# Patient Record
Sex: Female | Born: 1965 | Race: White | Marital: Married | Smoking: Former smoker
Health system: Northeastern US, Academic
[De-identification: ages and names within clinical notes are randomized; demographics above are authoritative.]

## PROBLEM LIST (undated history)

## (undated) DIAGNOSIS — I219 Acute myocardial infarction, unspecified: Secondary | ICD-10-CM

## (undated) DIAGNOSIS — R011 Cardiac murmur, unspecified: Secondary | ICD-10-CM

## (undated) DIAGNOSIS — F419 Anxiety disorder, unspecified: Secondary | ICD-10-CM

## (undated) HISTORY — DX: Acute myocardial infarction, unspecified: I21.9

---

## 2012-09-16 HISTORY — PX: LUMBAR LAMINECTOMY: SHX95

## 2014-09-16 HISTORY — PX: CHOLECYSTECTOMY: SHX55

## 2015-07-31 ENCOUNTER — Other Ambulatory Visit: Payer: Self-pay | Admitting: Internal Medicine

## 2015-07-31 LAB — POTASSIUM: Potassium: 3.6 meq/L^meq/L (ref 3.5–5.1)

## 2015-07-31 LAB — CREATININE, SERUM
Creatinine: 0.8 mg/dL^mg/dL (ref 0.6–1.3)
GFR,Black: 92 mL/min/{1.73_m2}
GFR,Caucasian: 76 mL/min/{1.73_m2}

## 2015-07-31 LAB — CBC AND DIFFERENTIAL
Baso # K/uL: 0.1 10*3/uL (ref 0.0–0.1)
Basophil %: 0.9 %^% (ref 0.1–1.2)
Eos # K/uL: 1.8 10*3/uL — ABNORMAL HIGH (ref 0.0–0.4)
Eosinophil %: 20.9 %^% — ABNORMAL HIGH (ref 0.7–5.8)
Hematocrit: 44.1 %^% (ref 34.1–44.9)
Hemoglobin: 15.4 g/dL^g/dL (ref 11.2–15.7)
Immature Granulocytes Absolute: 0.01 10*3/uL (ref 0.0–0.2)
Immature Granulocytes: 0.1 %^% (ref 0.0–2.0)
Lymph # K/uL: 2.1 10*3/uL (ref 1.2–3.7)
Lymphocyte %: 24.1 %^% (ref 19.3–51.7)
MCH: 33 pg^pg — ABNORMAL HIGH (ref 25.6–32.2)
MCHC: 34.9 g/dL^g/dL (ref 32.2–35.5)
MCV: 94.4 fL^fL (ref 79.4–94.8)
Mono # K/uL: 0.4 10*3/uL (ref 0.2–0.9)
Monocyte %: 4.9 %^% (ref 4.7–12.5)
Neut # K/uL: 4.2 10*3/uL (ref 1.6–6.0)
Nucl RBC %: 0 /100 WBC^/100 WBC (ref 0.0–0.2)
Platelets: 383 10*3/uL — ABNORMAL HIGH (ref 182–369)
RBC Distribution Width-SD: 43.9 fL^fL (ref 36.4–46.3)
RBC: 4.67 10*6/uL (ref 3.93–5.22)
RDW: 12.8 %^% (ref 11.7–14.4)
Seg Neut %: 49.1 %^% (ref 34.0–71.1)
WBC: 8.5 10*3/uL (ref 4.0–10.0)

## 2015-07-31 LAB — ALBUMIN: Albumin: 3.9 g/dL^g/dL (ref 3.4–5.0)

## 2015-07-31 LAB — ALKALINE PHOSPHATASE: Alk Phos: 94 U/L^U/L (ref 45–117)

## 2015-07-31 LAB — GGT: GGT: 88 U/L^U/L — ABNORMAL HIGH (ref 5–55)

## 2015-07-31 LAB — SODIUM: Sodium: 138 meq/L^meq/L (ref 136–145)

## 2015-07-31 LAB — T BILI: Bilirubin,Total: 1.4 mg/dL^mg/dL — ABNORMAL HIGH (ref 0.2–1.0)

## 2015-07-31 LAB — AMYLASE: Amylase: 76 U/L^U/L (ref 25–115)

## 2015-07-31 LAB — SEDIMENTATION RATE, AUTOMATED: Sedimentation Rate: 1 mm/hr^mm/hr (ref 0–20)

## 2015-07-31 LAB — AST: AST: 16 U/L^U/L (ref 11–37)

## 2015-07-31 LAB — LIPASE: Lipase: 156 U/L^U/L (ref 73–393)

## 2015-07-31 LAB — BUN: Lab: 15 mg/dL^mg/dL (ref 7–18)

## 2015-07-31 LAB — ALT: ALT: 21 U/L^U/L (ref 12–78)

## 2015-08-01 ENCOUNTER — Other Ambulatory Visit: Payer: Self-pay | Admitting: Internal Medicine

## 2015-08-02 LAB — TISSUE TRANSGLUT,IGA
IgA: 284 mg/dL^mg/dL (ref 70–400)
tTG,IgA: 5.1 AB/Units^AB/Units (ref 0.0–19.9)

## 2015-08-02 LAB — H. PYLORI ANTIBODY, IGG: H Pylori IgG: NEGATIVE

## 2015-08-22 ENCOUNTER — Other Ambulatory Visit: Payer: Self-pay | Admitting: Anesthesiology

## 2015-08-22 LAB — PREGNANCY, URINE: Preg Test,UR: NEGATIVE

## 2015-08-24 LAB — SURGICAL PATHOLOGY

## 2015-08-24 LAB — ENDOMYSIAL (EMA) AB IGG: Endomysial IgG: <1:10 {titer}

## 2016-10-24 ENCOUNTER — Ambulatory Visit: Payer: Self-pay | Admitting: Otolaryngology

## 2016-10-24 ENCOUNTER — Encounter: Payer: Self-pay | Admitting: Otolaryngology

## 2016-10-24 VITALS — BP 136/74 | HR 65 | Temp 96.5°F | Ht 62.0 in | Wt 135.0 lb

## 2016-10-24 DIAGNOSIS — H9202 Otalgia, left ear: Secondary | ICD-10-CM

## 2016-10-24 DIAGNOSIS — J3489 Other specified disorders of nose and nasal sinuses: Secondary | ICD-10-CM

## 2016-10-24 NOTE — Progress Notes (Signed)
Erika Pennington was referred by Dr. Provider.    Subjective  Chief Complaint: She presents today with   Chief Complaint   Patient presents with    New Patient Visit     left sided ear pain   .    HPI: This is a 51 y.o. old female, who presents for intermittent left ear pain x1 year. Pain is sharp, and is directly into her ear. Occasionally reports left eye pain at the same time. Denies any visual changes. Typically will last all day before resolving. Occurs 3-4 times a week. Occurs more at night and in the morning.  Grinds teeth. Guard does not fit, so not wearing. Never diagnosed with TMJ. Denies TMJ click or tenderness. Taking Tylenol. Avoids NSAIDS d/t plavix use. Denies any hx of migraines or headaches.  Denies any facial pain, numbness or tingling.  No head/neck trauma. Dentition good. No Cspine issues. No pain, numbness or tingling of upper extremities.  No hx of ear infections, drainage, or ear surgery. No tinnitus, dizziness, or vertigo. Hearing is good.     CT of the neck done on 08/20/16, at [facility] in [place]. The interpretation revealed a "small focal rounded low attenuating collection within the palatine tonsil on the left. Uncertain etiology".    Chronic nasal congestion and scabbing in nostrils.     She is just traveling through the area. She will be moving to [place] in the near future.    Outpatient Prescriptions Marked as Taking for the 10/24/16 encounter (Office Visit) with Jacinto Reap, NP   Medication Sig Dispense Refill    amLODIPine (NORVASC) 2.5 MG tablet Take 2.5 mg by mouth daily      aspirin 81 MG tablet Take 81 mg by mouth daily      isosorbide mononitrate (IMDUR) 30 MG 24 hr tablet Take 30 mg by mouth daily      clopidogrel (PLAVIX) 75 MG tablet Take 75 mg by mouth daily      clonazePAM (KLONOPIN) 0.25 MG disintegrating tablet Take 0.25 mg by mouth 2 times daily as needed for Anxiety         Allergies: Penicillins    Problem List:  does not have a problem list on  file.    Medical History:   Past Medical History:   Diagnosis Date    Heart attack 06/10/16       Surgical History:   Past Surgical History:   Procedure Laterality Date    LAMINECTOMY          Family History: family history is not on file.    Social History:   Social History   Substance Use Topics    Smoking status: Former Smoker    Smokeless tobacco: Never Used    Alcohol use Yes      Comment: occ       ROS: In addition to that mentioned in the HPI:  No fevers, chills, decreased energy, unexplained weight loss. All other systems are negative.     Objective  Vitals:    10/24/16 1302   BP: 136/74   Pulse: 65   Temp: 35.8 C (96.5 F)   Weight: 61.2 kg (135 lb)   Height: 1.575 m (5\' 2" )       Exam  Constitution:  Appears well developed and well nourished. No signs of acute distress present. Patient is cooperative and overall behavior is appropriate.  Head / Face: Atraumatic, normocephalic on inspection. No scars present. Percussion reveals no  sinus tenderness.  Eyes:  EOMI in both eyes. Conjunctivae clear. No periorbital edema of the upper and lower lids. PERRL. Sclerae clear.  Ears: Inspection reveals no lesion of the ears, swelling of the ears, or tenderness of the ears.  No discharge, mass or stenosis in the auditory canals. translucent, normal landmarks, and no retraction. No swelling or tenderness of the post auricular area bilaterally.  Nose: External Nose: exhibits no deformity or lesions. Internal Nose: No discharge from the nasal mucosae, no edema, no erythema of the nasal mucosae. Nasal Septum: large anterior septal perforation. Scabbing and dried blood noted. Nasal turbinates are normal in color and size.  Oral cavity: Lips appear normal and healthy. Dentition is normal for age. Gums appear healthy. Tongue shows a smooth surface and symmetry. Floor of the mouth appears normal. Salivary glands normal in size with no asymmetry. Submandibular and parotid ducts are patent bilaterally. Oral mucosa moist with  no thrush and no mucositis. Hard palate normal in appearance.  Oropharynx: No lesions or masses. Soft palate normal in appearance. Uvula midline and normal in size. Tonsils appear normal. Posterior pharyngeal mucosa appears normal.  Neck: Symmetric. Palpation reveals no swelling or tenderness. No masses appreciated. Trachea is midline and has good landmarks. Thyroid exhibits no palpable enlargement, nodules or tenderness on palpation.  Respiratory: Respiration rate is normal. Breath sounds are equal bilaterally, with no rales, rhonchi, or wheezes appreciated over the lungs bilaterally.  Cardiovascular: Rate is regular. Rhythm is regular.  Lymph:  No visible or palpable cervical lymphadenopathy. No visible or palpable supraclavicular lymphadenopathy.  Musculoskeletal: Temporal Mandibular Joints: ROM normal. No crepitus, pain, or pain with movement.  Skin:  Skin warm and dry with no evidence of unusual rashes or suspicious lesions.  Scalp normal to inspection.  Neurological: Alert and oriented x 3.  Psychological: Mood is normal. Patient's affect is appropriate to mood. Speech is spontaneous with regular rate, rhythm, and volume.    Assessment  51 y.o. female    ICD-10-CM ICD-9-CM    1. Otalgia, left H92.02 388.70    2. Nasal septum perforation J34.89 478.19        Plan    Saline nasal irrigation twice a day. Keep septum moist. Ayr saline gel. Run a humidifier.  Recommend having septal perforation evaluated when she gets established in [place].    F/U with Dr.Decicco. Will obtain an audio for further evaluation of the ear pain.  The ear pain may be d/t TMJ disorder or of a neurological etiology. I do not believe that her sx are consistent with trigeminal neuralgia.              Seen by : Jacinto ReapJACQUELINE E Emelee Rodocker, NP2/04/2017

## 2016-10-30 ENCOUNTER — Ambulatory Visit: Payer: Self-pay | Admitting: Otolaryngology

## 2016-11-04 ENCOUNTER — Ambulatory Visit: Payer: Self-pay | Admitting: Otolaryngology

## 2018-08-16 HISTORY — PX: LAPAROSCOPIC COLON RESECTION: SUR791

## 2019-07-02 ENCOUNTER — Other Ambulatory Visit: Payer: Self-pay | Admitting: Neurological Surgery

## 2019-07-14 NOTE — Pre-Procedure Instructions (Signed)
[id number] [place],  - [address] [place] [address] Phone: [phone] Fax: [phone]    Your procedure is scheduled on Mon., Nov. 2, 2020 from 12:10PM-3:13PM  Report to [facility] Entrance "A" at 10:00AM  Call this number if you have problems the morning of surgery:  [phone]   Remember:  Do not eat or drink after midnight on Nov. 1st    Take these medicines the morning of surgery with A SIP OF WATER: Baclofen (LIORESAL) Escitalopram (LEXAPRO)  Gabapentin (NEURONTIN)  If Needed: Fluticasone (FLONASE) Loratadine (CLARITIN)  OxyCODONE-acetaminophen (PERCOCET/ROXICET)  As of today, stop taking all Aspirin (unless instructed by your doctor) and Other Aspirin containing products, Vitamins, Fish oils, and Herbal medications. Also stop all NSAIDS i.e. Advil, Ibuprofen, Motrin, Aleve, Anaprox, Naproxen, BC, Goody Powders, and all Supplements.   No Smoking of any kind, Tobacco, or Alcohol products for 24 hours prior to your procedure. If you use a Cpap machine, you may bring all equipment the day of surgery.   Special instructions:   [facility]- Preparing For Surgery  Before surgery, you can play an important role. Because skin is not sterile, your skin needs to be as free of germs as possible. You can reduce the number of germs on your skin by washing with CHG (chlorahexidine gluconate) Soap before surgery.  CHG is an antiseptic cleaner which kills germs and bonds with the skin to continue killing germs even after washing.    Please do not use if you have an allergy to CHG or antibacterial soaps. If your skin becomes reddened/irritated stop using the CHG.  Do not shave (including legs and underarms) for at least 48 hours prior to first CHG shower. It is OK to shave your face.  Please follow these instructions carefully.   1. Shower the NIGHT BEFORE SURGERY and the MORNING OF SURGERY with CHG.    2. If you chose to wash your hair, wash your hair first as usual with your normal shampoo.  3. After you shampoo, rinse your hair and body thoroughly to remove the shampoo.  4. Use CHG as you would any other liquid soap. You can apply CHG directly to the skin and wash gently with a scrungie or a clean washcloth.   5. Apply the CHG Soap to your body ONLY FROM THE NECK DOWN.  Do not use on open wounds or open sores. Avoid contact with your eyes, ears, mouth and genitals (private parts). Wash Face and genitals (private parts)  with your normal soap.  6. Wash thoroughly, paying special attention to the area where your surgery will be performed.  7. Thoroughly rinse your body with warm water from the neck down.  8. DO NOT shower/wash with your normal soap after using and rinsing off the CHG Soap.  9. Pat yourself dry with a CLEAN TOWEL.  10. Wear CLEAN PAJAMAS to bed the night before surgery, wear comfortable clothes the morning of surgery  11. Place CLEAN SHEETS on your bed the night of your first shower and DO NOT SLEEP WITH PETS.   Day of Surgery:             Remember to brush your teeth WITH YOUR REGULAR TOOTHPASTE.   Do not wear jewelry, make-up or nail polish.  Do not wear lotions, powders, or perfumes, or deodorant.  Do not shave 48 hours prior to surgery.    Do not bring valuables to  the hospital.  [facility] is not responsible for any belongings or valuables.  Contacts, dentures or bridgework may not be worn into surgery. For patients admitted to the hospital, discharge time will be determined by your treatment team.  Patients discharged the day of surgery will not be allowed to drive home, and someone age 3 and over needs to stay with them for 24 hours.  Please wear clean clothes to the hospital/surgery center.    Please read over the following fact sheets that you were given.

## 2019-07-15 ENCOUNTER — Ambulatory Visit (HOSPITAL_COMMUNITY)
Admission: RE | Admit: 2019-07-15 | Discharge: 2019-07-15 | Disposition: A | Discharge status: Home / Self Care | Payer: Worker's Compensation | Source: Ambulatory Visit | Attending: Neurological Surgery | Admitting: Neurological Surgery

## 2019-07-15 ENCOUNTER — Other Ambulatory Visit (HOSPITAL_COMMUNITY)
Admission: RE | Admit: 2019-07-15 | Discharge: 2019-07-15 | Disposition: A | Discharge status: Home / Self Care | Payer: Self-pay | Source: Ambulatory Visit | Attending: Neurological Surgery | Admitting: Neurological Surgery

## 2019-07-15 ENCOUNTER — Other Ambulatory Visit: Payer: Self-pay

## 2019-07-15 ENCOUNTER — Encounter (HOSPITAL_COMMUNITY): Payer: Self-pay

## 2019-07-15 ENCOUNTER — Encounter (HOSPITAL_COMMUNITY)
Admission: RE | Admit: 2019-07-15 | Discharge: 2019-07-15 | Disposition: A | Discharge status: Home / Self Care | Payer: Worker's Compensation | Source: Ambulatory Visit | Attending: Neurological Surgery | Admitting: Neurological Surgery

## 2019-07-15 DIAGNOSIS — M5126 Other intervertebral disc displacement, lumbar region: Secondary | ICD-10-CM | POA: Diagnosis not present

## 2019-07-15 DIAGNOSIS — M5136 Other intervertebral disc degeneration, lumbar region: Secondary | ICD-10-CM | POA: Diagnosis present

## 2019-07-15 DIAGNOSIS — Z01818 Encounter for other preprocedural examination: Secondary | ICD-10-CM | POA: Diagnosis present

## 2019-07-15 DIAGNOSIS — Z20828 Contact with and (suspected) exposure to other viral communicable diseases: Secondary | ICD-10-CM | POA: Insufficient documentation

## 2019-07-15 DIAGNOSIS — Z01812 Encounter for preprocedural laboratory examination: Secondary | ICD-10-CM | POA: Insufficient documentation

## 2019-07-15 HISTORY — DX: Anxiety disorder, unspecified: F41.9

## 2019-07-15 HISTORY — DX: Acute myocardial infarction, unspecified: I21.9

## 2019-07-15 HISTORY — DX: Cardiac murmur, unspecified: R01.1

## 2019-07-15 LAB — BASIC METABOLIC PANEL
Anion gap: 13 (ref 5–15)
BUN: 11 mg/dL (ref 6–20)
CO2: 23 mmol/L (ref 22–32)
Calcium: 9.8 mg/dL (ref 8.9–10.3)
Chloride: 100 mmol/L (ref 98–111)
Creatinine, Ser: 0.84 mg/dL (ref 0.44–1.00)
GFR calc Af Amer: >60 mL/min (ref >60)
GFR calc non Af Amer: >60 mL/min (ref >60)
Glucose, Bld: 88 mg/dL (ref 70–99)
Potassium: 3.8 mmol/L (ref 3.5–5.1)
Sodium: 136 mmol/L (ref 135–145)

## 2019-07-15 LAB — CBC WITH DIFFERENTIAL/PLATELET
Abs Immature Granulocytes: 0.02 10*3/uL (ref 0.00–0.07)
Basophils Absolute: 0.1 10*3/uL (ref 0.0–0.1)
Basophils Relative: 1 %
Eosinophils Absolute: 0.2 10*3/uL (ref 0.0–0.5)
Eosinophils Relative: 2 %
HCT: 45.8 % (ref 36.0–46.0)
Hemoglobin: 15.4 g/dL — ABNORMAL HIGH (ref 12.0–15.0)
Immature Granulocytes: 0 %
Lymphocytes Relative: 21 %
Lymphs Abs: 1.9 10*3/uL (ref 0.7–4.0)
MCH: 33 pg (ref 26.0–34.0)
MCHC: 33.6 g/dL (ref 30.0–36.0)
MCV: 98.1 fL (ref 80.0–100.0)
Monocytes Absolute: 0.4 10*3/uL (ref 0.1–1.0)
Monocytes Relative: 5 %
Neutro Abs: 6.2 10*3/uL (ref 1.7–7.7)
Neutrophils Relative %: 71 %
Platelets: 240 10*3/uL (ref 150–400)
RBC: 4.67 MIL/uL (ref 3.87–5.11)
RDW: 13 % (ref 11.5–15.5)
WBC: 8.7 10*3/uL (ref 4.0–10.5)
nRBC: 0 % (ref 0.0–0.2)

## 2019-07-15 LAB — TYPE AND SCREEN
ABO/RH(D): A POS
Antibody Screen: NEGATIVE

## 2019-07-15 LAB — PROTIME-INR
INR: 1 (ref 0.8–1.2)
Prothrombin Time: 12.6 seconds (ref 11.4–15.2)

## 2019-07-15 LAB — SURGICAL PCR SCREEN
MRSA, PCR: NEGATIVE
Staphylococcus aureus: NEGATIVE

## 2019-07-15 LAB — ABO/RH: ABO/RH(D): A POS

## 2019-07-15 LAB — SARS CORONAVIRUS 2 (TAT 6-24 HRS): SARS Coronavirus 2: NEGATIVE

## 2019-07-15 NOTE — Progress Notes (Addendum)
PCP - Bethena Midget, FNP Cardiologist - Dr. Audie Clear  PPM/ICD - N/A Device Orders -  Rep Notified -   Chest x-ray - 07/15/19 EKG - requested from Dr. Caesar Bookman office.  Stress Test - 2019-requested, 11/27/16 report in [facility].  ECHO - 2019 requested, 11/27/16 report in [facility]. Cardiac Cath - denies  Sleep Study - denies CPAP - denies  Blood Thinner Instructions:N/A Aspirin Instructions:N/A  ERAS Protcol -N/A PRE-SURGERY Ensure or G2-N/A   COVID TEST- 07/15/19, pt aware of quarantine after testing.    Anesthesia review: Yes, pending cardiac clearance and cardiac studies fax from Dr. Ronnald Ramp and Dr. Caesar Bookman office.    Patient denies shortness of breath, fever, cough and chest pain at PAT appointment   All instructions explained to the patient, with a verbal understanding of the material. Patient agrees to go over the instructions while at home for a better understanding. Patient also instructed to self quarantine after being tested for COVID-19. The opportunity to ask questions was provided.   Coronavirus Screening  Have you experienced the following symptoms:  Cough yes/no: No Fever (>100.7F)  yes/no: No Runny nose yes/no: No Sore throat yes/no: No Difficulty breathing/shortness of breath  yes/no: No  Have you or a family member traveled in the last 14 days and where? yes/no: No   If the patient indicates "YES" to the above questions, their PAT will be rescheduled to limit the exposure to others and, the surgeon will be notified. THE PATIENT WILL NEED TO BE ASYMPTOMATIC FOR 14 DAYS.   If the patient is not experiencing any of these symptoms, the PAT nurse will instruct them to NOT bring anyone with them to their appointment since they may have these symptoms or traveled as well.   Please remind your patients and families that hospital visitation restrictions are in effect and the importance of the restrictions.

## 2019-07-16 NOTE — Anesthesia Preprocedure Evaluation (Addendum)
Anesthesia Evaluation  Patient identified by MRN, date of birth, ID band Patient awake    Reviewed: Allergy & Precautions, NPO status , Patient's Chart, lab work & pertinent test results  Airway Mallampati: I       Dental no notable dental hx. (+) Teeth Intact   Pulmonary neg pulmonary ROS,    Pulmonary exam normal breath sounds clear to auscultation       Cardiovascular Normal cardiovascular exam Rhythm:Regular Rate:Normal     Neuro/Psych PSYCHIATRIC DISORDERS Anxiety negative neurological ROS     GI/Hepatic negative GI ROS, Neg liver ROS,   Endo/Other  negative endocrine ROS  Renal/GU negative Renal ROS  negative genitourinary   Musculoskeletal negative musculoskeletal ROS (+)   Abdominal Normal abdominal exam  (+)   Peds  Hematology negative hematology ROS (+)   Anesthesia Other Findings   Reproductive/Obstetrics                             Anesthesia Physical Anesthesia Plan  ASA: II  Anesthesia Plan: General   Post-op Pain Management:    Induction: Intravenous  PONV Risk Score and Plan: 3 and Ondansetron, Dexamethasone and Midazolam  Airway Management Planned: Oral ETT  Additional Equipment: None  Intra-op Plan:   Post-operative Plan: Extubation in OR  Informed Consent: I have reviewed the patients History and Physical, chart, labs and discussed the procedure including the risks, benefits and alternatives for the proposed anesthesia with the patient or authorized representative who has indicated his/her understanding and acceptance.     Dental advisory given  Plan Discussed with: CRNA  Anesthesia Plan Comments: (History of Takotsubo cardiomyopathy from which she fully recovered. Echo in 2018 showed EF > 55%, no significant valvular pathology. Nuclear stress was nonischemic, low risk. Cardiologist is Dr. Babette Relic, he cleared the pt as low risk on 07/01/19, copy on  chart.  Preop labs WNL.  EKG requested, if not received will need DOS.  TTE 11/26/16 (care everywhere):  NORMAL GLOBAL LEFT VENTRICULAR SYSTOLIC FUNCTION, WITH AN EJECTION FRACTION OF 55%.  NORMAL DIASTOLIC FUNCTION.  NORMAL RIGHT VENTRICULAR GLOBAL SYSTOLIC FUNCTION.  NO HEMODYNAMICALLY SIGNIFICANT VALVULAR PATHOLOGY.  NO PREVIOUS REPORT FOR COMPARISON.  Nuclear stress 11/27/16:  THIS MYOCARDIAL PERFUSION STUDY IS NORMAL  THIS IS A LOW RISK STUDY  1. Maximal treadmill sestamibi stress test showing no evidence of myocardial ischemia  2. Normal post stress gated wall motion study and normal ejection fraction at 67%)   Anesthesia Quick Evaluation

## 2019-07-16 NOTE — Progress Notes (Signed)
Anesthesia Chart Review: History of Takotsubo cardiomyopathy from which she fully recovered. Echo in 2018 showed EF > 55%, no significant valvular pathology. Nuclear stress was nonischemic, low risk. Cardiologist is Dr. Babette Relic, he cleared the pt as low risk on 07/01/19, copy on chart.  Preop labs WNL.  EKG requested, if not received will need DOS.  TTE 11/26/16 (care everywhere):  NORMAL GLOBAL LEFT VENTRICULAR SYSTOLIC FUNCTION, WITH AN EJECTION FRACTION OF 55%.  NORMAL DIASTOLIC FUNCTION.  NORMAL RIGHT VENTRICULAR GLOBAL SYSTOLIC FUNCTION.  NO HEMODYNAMICALLY SIGNIFICANT VALVULAR PATHOLOGY.  NO PREVIOUS REPORT FOR COMPARISON.  Nuclear stress 11/27/16:  THIS MYOCARDIAL PERFUSION STUDY IS NORMAL  THIS IS A LOW RISK STUDY  1. Maximal treadmill sestamibi stress test showing no evidence of myocardial ischemia  2. Normal post stress gated wall motion study and normal ejection fraction at 67%   Wyn[facility] Musty [facility] Short Stay Center/Anesthesiology Phone [phone] 07/16/2019 1:41 PM

## 2019-07-19 ENCOUNTER — Encounter (HOSPITAL_COMMUNITY)
Admission: RE | Disposition: A | Discharge status: Home / Self Care | Payer: Self-pay | Source: Home / Self Care | Attending: Neurological Surgery

## 2019-07-19 ENCOUNTER — Inpatient Hospital Stay (HOSPITAL_COMMUNITY): Payer: Worker's Compensation | Admitting: Certified Registered Nurse Anesthetist

## 2019-07-19 ENCOUNTER — Other Ambulatory Visit: Payer: Self-pay

## 2019-07-19 ENCOUNTER — Encounter (HOSPITAL_COMMUNITY): Payer: Self-pay | Admitting: Surgery

## 2019-07-19 ENCOUNTER — Inpatient Hospital Stay (HOSPITAL_COMMUNITY)
Admission: RE | Admit: 2019-07-19 | Discharge: 2019-07-20 | DRG: 455 | Disposition: A | Discharge status: Home / Self Care | Payer: Worker's Compensation | Attending: Neurological Surgery | Admitting: Neurological Surgery

## 2019-07-19 ENCOUNTER — Inpatient Hospital Stay (HOSPITAL_COMMUNITY): Payer: Self-pay | Attending: Neurological Surgery

## 2019-07-19 ENCOUNTER — Inpatient Hospital Stay (HOSPITAL_COMMUNITY): Payer: Worker's Compensation | Admitting: Physician Assistant

## 2019-07-19 DIAGNOSIS — F419 Anxiety disorder, unspecified: Secondary | ICD-10-CM | POA: Diagnosis present

## 2019-07-19 DIAGNOSIS — M47816 Spondylosis without myelopathy or radiculopathy, lumbar region: Secondary | ICD-10-CM | POA: Diagnosis present

## 2019-07-19 DIAGNOSIS — M5136 Other intervertebral disc degeneration, lumbar region: Principal | ICD-10-CM | POA: Diagnosis present

## 2019-07-19 DIAGNOSIS — M48061 Spinal stenosis, lumbar region without neurogenic claudication: Secondary | ICD-10-CM | POA: Diagnosis not present

## 2019-07-19 DIAGNOSIS — Z981 Arthrodesis status: Secondary | ICD-10-CM

## 2019-07-19 DIAGNOSIS — Z419 Encounter for procedure for purposes other than remedying health state, unspecified: Secondary | ICD-10-CM

## 2019-07-19 DIAGNOSIS — X58XXXA Exposure to other specified factors, initial encounter: Secondary | ICD-10-CM | POA: Diagnosis present

## 2019-07-19 DIAGNOSIS — Z20828 Contact with and (suspected) exposure to other viral communicable diseases: Secondary | ICD-10-CM | POA: Diagnosis not present

## 2019-07-19 DIAGNOSIS — Z9049 Acquired absence of other specified parts of digestive tract: Secondary | ICD-10-CM | POA: Diagnosis not present

## 2019-07-19 DIAGNOSIS — Z79899 Other long term (current) drug therapy: Secondary | ICD-10-CM

## 2019-07-19 DIAGNOSIS — I252 Old myocardial infarction: Secondary | ICD-10-CM

## 2019-07-19 DIAGNOSIS — Y990 Civilian activity done for income or pay: Secondary | ICD-10-CM

## 2019-07-19 SURGERY — POSTERIOR LUMBAR FUSION 1 LEVEL
Anesthesia: General | Site: Back

## 2019-07-19 MED ORDER — VANCOMYCIN HCL IN DEXTROSE 1-5 GM/200ML-% IV SOLN
INTRAVENOUS | Status: AC
  Filled 2019-07-19: qty 200

## 2019-07-19 MED ORDER — METHOCARBAMOL 500 MG PO TABS
500.0000 mg | ORAL_TABLET | Freq: Four times a day (QID) | ORAL | Status: DC | PRN
  Administered 2019-07-19 – 2019-07-20 (×2): 500 mg via ORAL
  Filled 2019-07-19 (×2): qty 1

## 2019-07-19 MED ORDER — PROPOFOL 10 MG/ML IV BOLUS
INTRAVENOUS | Status: AC
  Filled 2019-07-19: qty 20

## 2019-07-19 MED ORDER — HYDROMORPHONE HCL 1 MG/ML IJ SOLN
INTRAMUSCULAR | Status: AC
  Administered 2019-07-19: 0.25 mg via INTRAVENOUS
  Filled 2019-07-19: qty 1

## 2019-07-19 MED ORDER — ARTHREX ANGEL - ACD-A SOLUTION (CHARTING ONLY) OPTIME
TOPICAL | Status: DC | PRN
  Administered 2019-07-19: 10 mL via TOPICAL

## 2019-07-19 MED ORDER — SUGAMMADEX SODIUM 200 MG/2ML IV SOLN
INTRAVENOUS | Status: DC | PRN
  Administered 2019-07-19: 50 mg via INTRAVENOUS
  Administered 2019-07-19: 100 mg via INTRAVENOUS

## 2019-07-19 MED ORDER — ONDANSETRON HCL 4 MG/2ML IJ SOLN: 4.0000 mg | Freq: Four times a day (QID) | INTRAMUSCULAR | Status: DC | PRN

## 2019-07-19 MED ORDER — MIDAZOLAM HCL 5 MG/5ML IJ SOLN
INTRAMUSCULAR | Status: DC | PRN
  Administered 2019-07-19: 2 mg via INTRAVENOUS

## 2019-07-19 MED ORDER — MIDAZOLAM HCL 2 MG/2ML IJ SOLN
INTRAMUSCULAR | Status: AC
  Filled 2019-07-19: qty 2

## 2019-07-19 MED ORDER — CEFAZOLIN SODIUM-DEXTROSE 2-4 GM/100ML-% IV SOLN: 2.0000 g | Freq: Three times a day (TID) | INTRAVENOUS | Status: DC

## 2019-07-19 MED ORDER — VANCOMYCIN HCL IN DEXTROSE 1-5 GM/200ML-% IV SOLN
1000.0000 mg | Freq: Once | INTRAVENOUS | Status: AC
  Administered 2019-07-20: 1000 mg via INTRAVENOUS
  Filled 2019-07-19: qty 200

## 2019-07-19 MED ORDER — LIDOCAINE 2% (20 MG/ML) 5 ML SYRINGE
INTRAMUSCULAR | Status: AC
  Filled 2019-07-19: qty 5

## 2019-07-19 MED ORDER — 0.9 % SODIUM CHLORIDE (POUR BTL) OPTIME
TOPICAL | Status: DC | PRN
  Administered 2019-07-19: 1000 mL

## 2019-07-19 MED ORDER — CHLORHEXIDINE GLUCONATE CLOTH 2 % EX PADS: 6.0000 | MEDICATED_PAD | Freq: Once | CUTANEOUS | Status: DC

## 2019-07-19 MED ORDER — HEPARIN SODIUM (PORCINE) 1000 UNIT/ML IJ SOLN
INTRAMUSCULAR | Status: DC | PRN
  Administered 2019-07-19: 5000 [IU]

## 2019-07-19 MED ORDER — SODIUM CHLORIDE 0.9 % IV SOLN: 250.0000 mL | INTRAVENOUS | Status: DC

## 2019-07-19 MED ORDER — MENTHOL 3 MG MT LOZG: 1.0000 | LOZENGE | OROMUCOSAL | Status: DC | PRN

## 2019-07-19 MED ORDER — GLYCOPYRROLATE 0.2 MG/ML IJ SOLN
INTRAMUSCULAR | Status: DC | PRN
  Administered 2019-07-19: 0.2 mg via INTRAVENOUS

## 2019-07-19 MED ORDER — ROCURONIUM BROMIDE 10 MG/ML (PF) SYRINGE
PREFILLED_SYRINGE | INTRAVENOUS | Status: AC
  Filled 2019-07-19: qty 10

## 2019-07-19 MED ORDER — KETAMINE HCL 10 MG/ML IJ SOLN
INTRAMUSCULAR | Status: DC | PRN
  Administered 2019-07-19: 10 mg via INTRAVENOUS
  Administered 2019-07-19: 20 mg via INTRAVENOUS

## 2019-07-19 MED ORDER — DEXAMETHASONE SODIUM PHOSPHATE 10 MG/ML IJ SOLN
INTRAMUSCULAR | Status: AC
  Filled 2019-07-19: qty 1

## 2019-07-19 MED ORDER — LACTATED RINGERS IV SOLN: INTRAVENOUS | Status: DC

## 2019-07-19 MED ORDER — ROCURONIUM BROMIDE 10 MG/ML (PF) SYRINGE
PREFILLED_SYRINGE | INTRAVENOUS | Status: AC
  Filled 2019-07-19: qty 20

## 2019-07-19 MED ORDER — BUPIVACAINE HCL (PF) 0.25 % IJ SOLN
INTRAMUSCULAR | Status: AC
  Filled 2019-07-19: qty 30

## 2019-07-19 MED ORDER — THROMBIN 5000 UNITS EX SOLR
CUTANEOUS | Status: AC
  Filled 2019-07-19: qty 5000

## 2019-07-19 MED ORDER — CEFAZOLIN SODIUM-DEXTROSE 2-4 GM/100ML-% IV SOLN: 2.0000 g | INTRAVENOUS | Status: DC

## 2019-07-19 MED ORDER — PHENYLEPHRINE 40 MCG/ML (10ML) SYRINGE FOR IV PUSH (FOR BLOOD PRESSURE SUPPORT)
PREFILLED_SYRINGE | INTRAVENOUS | Status: DC | PRN
  Administered 2019-07-19: 80 ug via INTRAVENOUS

## 2019-07-19 MED ORDER — ONDANSETRON HCL 4 MG/2ML IJ SOLN
INTRAMUSCULAR | Status: DC | PRN
  Administered 2019-07-19: 4 mg via INTRAVENOUS

## 2019-07-19 MED ORDER — ROCURONIUM BROMIDE 10 MG/ML (PF) SYRINGE
PREFILLED_SYRINGE | INTRAVENOUS | Status: DC | PRN
  Administered 2019-07-19 (×2): 20 mg via INTRAVENOUS
  Administered 2019-07-19: 60 mg via INTRAVENOUS

## 2019-07-19 MED ORDER — DEXAMETHASONE SODIUM PHOSPHATE 10 MG/ML IJ SOLN
10.0000 mg | Freq: Once | INTRAMUSCULAR | Status: AC
  Administered 2019-07-19: 10 mg via INTRAVENOUS
  Filled 2019-07-19: qty 1

## 2019-07-19 MED ORDER — FENTANYL CITRATE (PF) 250 MCG/5ML IJ SOLN
INTRAMUSCULAR | Status: DC | PRN
  Administered 2019-07-19 (×2): 50 ug via INTRAVENOUS
  Administered 2019-07-19: 25 ug via INTRAVENOUS
  Administered 2019-07-19 (×2): 50 ug via INTRAVENOUS
  Administered 2019-07-19: 25 ug via INTRAVENOUS

## 2019-07-19 MED ORDER — PROMETHAZINE HCL 25 MG/ML IJ SOLN: 6.2500 mg | INTRAMUSCULAR | Status: DC | PRN

## 2019-07-19 MED ORDER — MORPHINE SULFATE (PF) 2 MG/ML IV SOLN
2.0000 mg | INTRAVENOUS | Status: DC | PRN
  Administered 2019-07-19: 2 mg via INTRAVENOUS
  Filled 2019-07-19: qty 1

## 2019-07-19 MED ORDER — DEXAMETHASONE SODIUM PHOSPHATE 4 MG/ML IJ SOLN
4.0000 mg | Freq: Four times a day (QID) | INTRAMUSCULAR | Status: DC
  Administered 2019-07-19: 4 mg via INTRAVENOUS
  Filled 2019-07-19 (×2): qty 1

## 2019-07-19 MED ORDER — SODIUM CHLORIDE 0.9% FLUSH: 3.0000 mL | INTRAVENOUS | Status: DC | PRN

## 2019-07-19 MED ORDER — ONDANSETRON HCL 4 MG/2ML IJ SOLN
INTRAMUSCULAR | Status: AC
  Filled 2019-07-19: qty 2

## 2019-07-19 MED ORDER — BACLOFEN 10 MG PO TABS
10.0000 mg | ORAL_TABLET | Freq: Three times a day (TID) | ORAL | Status: DC
  Administered 2019-07-19 (×2): 10 mg via ORAL
  Filled 2019-07-19 (×2): qty 1

## 2019-07-19 MED ORDER — LIDOCAINE 2% (20 MG/ML) 5 ML SYRINGE
INTRAMUSCULAR | Status: DC | PRN
  Administered 2019-07-19: 60 mg via INTRAVENOUS

## 2019-07-19 MED ORDER — SODIUM CHLORIDE (PF) 0.9 % IJ SOLN
INTRAMUSCULAR | Status: DC | PRN
  Administered 2019-07-19: 5 mL

## 2019-07-19 MED ORDER — ACETAMINOPHEN 650 MG RE SUPP: 650.0000 mg | RECTAL | Status: DC | PRN

## 2019-07-19 MED ORDER — KETAMINE HCL 50 MG/5ML IJ SOSY
PREFILLED_SYRINGE | INTRAMUSCULAR | Status: AC
  Filled 2019-07-19: qty 5

## 2019-07-19 MED ORDER — PHENOL 1.4 % MT LIQD: 1.0000 | OROMUCOSAL | Status: DC | PRN

## 2019-07-19 MED ORDER — HYDROMORPHONE HCL 1 MG/ML IJ SOLN
0.2500 mg | INTRAMUSCULAR | Status: DC | PRN
  Administered 2019-07-19 (×2): 0.25 mg via INTRAVENOUS

## 2019-07-19 MED ORDER — THROMBIN 20000 UNITS EX SOLR
CUTANEOUS | Status: DC | PRN
  Administered 2019-07-19: 20 mL via TOPICAL

## 2019-07-19 MED ORDER — GLYCOPYRROLATE 0.2 MG/ML IJ SOLN
INTRAMUSCULAR | Status: AC
  Filled 2019-07-19: qty 1

## 2019-07-19 MED ORDER — METHOCARBAMOL 1000 MG/10ML IJ SOLN
500.0000 mg | Freq: Four times a day (QID) | INTRAVENOUS | Status: DC | PRN
  Filled 2019-07-19: qty 5

## 2019-07-19 MED ORDER — HYDROMORPHONE HCL 1 MG/ML IJ SOLN
0.5000 mg | INTRAMUSCULAR | Status: DC | PRN
  Administered 2019-07-19 – 2019-07-20 (×2): 1 mg via INTRAVENOUS
  Filled 2019-07-19 (×2): qty 1

## 2019-07-19 MED ORDER — BUPIVACAINE HCL (PF) 0.25 % IJ SOLN
INTRAMUSCULAR | Status: DC | PRN
  Administered 2019-07-19: 9.5 mL
  Administered 2019-07-19: 7 mL

## 2019-07-19 MED ORDER — VANCOMYCIN HCL 1000 MG IV SOLR
INTRAVENOUS | Status: DC | PRN
  Administered 2019-07-19: 1000 mg via INTRAVENOUS

## 2019-07-19 MED ORDER — ESCITALOPRAM OXALATE 10 MG PO TABS
10.0000 mg | ORAL_TABLET | Freq: Every day | ORAL | Status: DC
  Filled 2019-07-19: qty 1

## 2019-07-19 MED ORDER — THROMBIN 5000 UNITS EX SOLR
OROMUCOSAL | Status: DC | PRN
  Administered 2019-07-19: 14:00:00 5 mL via TOPICAL

## 2019-07-19 MED ORDER — FENTANYL CITRATE (PF) 250 MCG/5ML IJ SOLN
INTRAMUSCULAR | Status: AC
  Filled 2019-07-19: qty 5

## 2019-07-19 MED ORDER — THROMBIN 20000 UNITS EX SOLR
CUTANEOUS | Status: AC
  Filled 2019-07-19: qty 20000

## 2019-07-19 MED ORDER — ACETAMINOPHEN 325 MG PO TABS: 650.0000 mg | ORAL_TABLET | ORAL | Status: DC | PRN

## 2019-07-19 MED ORDER — SENNA 8.6 MG PO TABS
1.0000 | ORAL_TABLET | Freq: Two times a day (BID) | ORAL | Status: DC
  Administered 2019-07-19: 8.6 mg via ORAL
  Filled 2019-07-19: qty 1

## 2019-07-19 MED ORDER — SODIUM CHLORIDE 0.9 % IV SOLN
INTRAVENOUS | Status: DC | PRN
  Administered 2019-07-19: 14:00:00 500 mL

## 2019-07-19 MED ORDER — CELECOXIB 200 MG PO CAPS
200.0000 mg | ORAL_CAPSULE | Freq: Two times a day (BID) | ORAL | Status: DC
  Administered 2019-07-19: 200 mg via ORAL
  Filled 2019-07-19: qty 1

## 2019-07-19 MED ORDER — POTASSIUM CHLORIDE IN NACL 20-0.9 MEQ/L-% IV SOLN: INTRAVENOUS | Status: DC

## 2019-07-19 MED ORDER — SODIUM CHLORIDE 0.9% FLUSH
3.0000 mL | Freq: Two times a day (BID) | INTRAVENOUS | Status: DC
  Administered 2019-07-19: 21:00:00 3 mL via INTRAVENOUS

## 2019-07-19 MED ORDER — ONDANSETRON HCL 4 MG PO TABS: 4.0000 mg | ORAL_TABLET | Freq: Four times a day (QID) | ORAL | Status: DC | PRN

## 2019-07-19 MED ORDER — DEXAMETHASONE 4 MG PO TABS
4.0000 mg | ORAL_TABLET | Freq: Four times a day (QID) | ORAL | Status: DC
  Administered 2019-07-20 (×2): 4 mg via ORAL
  Filled 2019-07-19 (×2): qty 1

## 2019-07-19 MED ORDER — PROPOFOL 10 MG/ML IV BOLUS
INTRAVENOUS | Status: DC | PRN
  Administered 2019-07-19: 150 mg via INTRAVENOUS

## 2019-07-19 MED ORDER — GABAPENTIN 300 MG PO CAPS
300.0000 mg | ORAL_CAPSULE | Freq: Two times a day (BID) | ORAL | Status: DC
  Administered 2019-07-19: 21:00:00 300 mg via ORAL
  Filled 2019-07-19: qty 1

## 2019-07-19 MED ORDER — KETOROLAC TROMETHAMINE 30 MG/ML IJ SOLN: 30.0000 mg | Freq: Once | INTRAMUSCULAR | Status: DC | PRN

## 2019-07-19 MED ORDER — LACTATED RINGERS IV SOLN
INTRAVENOUS | Status: DC | PRN
  Administered 2019-07-19 (×2): via INTRAVENOUS

## 2019-07-19 MED ORDER — OXYCODONE HCL 5 MG PO TABS
10.0000 mg | ORAL_TABLET | ORAL | Status: DC | PRN
  Administered 2019-07-19 – 2019-07-20 (×4): 10 mg via ORAL
  Filled 2019-07-19 (×4): qty 2

## 2019-07-19 MED ORDER — MEPERIDINE HCL 25 MG/ML IJ SOLN: 6.2500 mg | INTRAMUSCULAR | Status: DC | PRN

## 2019-07-19 SURGICAL SUPPLY — 64 items
BAG DECANTER FOR FLEXI CONT (MISCELLANEOUS) ×3 IMPLANT
BASKET BONE COLLECTION (BASKET) ×3 IMPLANT
BENZOIN TINCTURE PRP APPL 2/3 (GAUZE/BANDAGES/DRESSINGS) ×3 IMPLANT
BLADE CLIPPER SURG (BLADE) IMPLANT
BUR MATCHSTICK NEURO 3.0 LAGG (BURR) ×3 IMPLANT
CANISTER SUCT 3000ML PPV (MISCELLANEOUS) ×3 IMPLANT
CARTRIDGE OIL MAESTRO DRILL (MISCELLANEOUS) ×1 IMPLANT
CLOSURE WOUND 1/2 X4 (GAUZE/BANDAGES/DRESSINGS) ×1
CONT SPEC 4OZ CLIKSEAL STRL BL (MISCELLANEOUS) ×3 IMPLANT
COVER BACK TABLE 60X90IN (DRAPES) ×3 IMPLANT
COVER WAND RF STERILE (DRAPES) IMPLANT
DERMABOND ADVANCED (GAUZE/BANDAGES/DRESSINGS) ×2
DERMABOND ADVANCED .7 DNX12 (GAUZE/BANDAGES/DRESSINGS) ×1 IMPLANT
DIFFUSER DRILL AIR PNEUMATIC (MISCELLANEOUS) ×3 IMPLANT
DRAPE C-ARM 42X72 X-RAY (DRAPES) ×3 IMPLANT
DRAPE LAPAROTOMY 100X72X124 (DRAPES) ×3 IMPLANT
DRAPE SURG 17X23 STRL (DRAPES) ×3 IMPLANT
DRSG OPSITE POSTOP 4X6 (GAUZE/BANDAGES/DRESSINGS) ×3 IMPLANT
DURAPREP 26ML APPLICATOR (WOUND CARE) ×3 IMPLANT
ELECT REM PT RETURN 9FT ADLT (ELECTROSURGICAL) ×3
ELECTRODE REM PT RTRN 9FT ADLT (ELECTROSURGICAL) ×1 IMPLANT
EVACUATOR 1/8 PVC DRAIN (DRAIN) IMPLANT
GAUZE 4X4 16PLY RFD (DISPOSABLE) IMPLANT
GLOVE BIO SURGEON STRL SZ7 (GLOVE) IMPLANT
GLOVE BIO SURGEON STRL SZ8 (GLOVE) ×6 IMPLANT
GLOVE BIOGEL PI IND STRL 7.0 (GLOVE) IMPLANT
GLOVE BIOGEL PI IND STRL 7.5 (GLOVE) ×1 IMPLANT
GLOVE BIOGEL PI INDICATOR 7.0 (GLOVE)
GLOVE BIOGEL PI INDICATOR 7.5 (GLOVE) ×2
GLOVE ECLIPSE 7.0 STRL STRAW (GLOVE) ×3 IMPLANT
GLOVE INDICATOR 7.5 STRL GRN (GLOVE) ×3 IMPLANT
GOWN STRL REUS W/ TWL LRG LVL3 (GOWN DISPOSABLE) IMPLANT
GOWN STRL REUS W/ TWL XL LVL3 (GOWN DISPOSABLE) ×2 IMPLANT
GOWN STRL REUS W/TWL 2XL LVL3 (GOWN DISPOSABLE) IMPLANT
GOWN STRL REUS W/TWL LRG LVL3 (GOWN DISPOSABLE)
GOWN STRL REUS W/TWL XL LVL3 (GOWN DISPOSABLE) ×4
HEMOSTAT POWDER KIT SURGIFOAM (HEMOSTASIS) ×3 IMPLANT
KIT BASIN OR (CUSTOM PROCEDURE TRAY) ×3 IMPLANT
KIT BONE MRW ASP ANGEL CPRP (KITS) ×3 IMPLANT
KIT TURNOVER KIT B (KITS) ×3 IMPLANT
MILL MEDIUM DISP (BLADE) ×3 IMPLANT
NEEDLE HYPO 25X1 1.5 SAFETY (NEEDLE) ×3 IMPLANT
NS IRRIG 1000ML POUR BTL (IV SOLUTION) ×3 IMPLANT
OIL CARTRIDGE MAESTRO DRILL (MISCELLANEOUS) ×3
PACK LAMINECTOMY NEURO (CUSTOM PROCEDURE TRAY) ×3 IMPLANT
PAD ARMBOARD 7.5X6 YLW CONV (MISCELLANEOUS) ×9 IMPLANT
PUTTY DBM ALLOSYNC PURE 10CC (Putty) ×3 IMPLANT
ROD LORD LIPPED TI 5.5X35 (Rod) ×6 IMPLANT
SCREW POLYAXIAL TULIP (Screw) ×12 IMPLANT
SCREW SHANK MOD 5.5X40 (Screw) ×12 IMPLANT
SET SCREW (Screw) ×8 IMPLANT
SET SCREW SPNE (Screw) ×4 IMPLANT
SPACER PS POROUS 8X9X25 10D (Spacer) ×6 IMPLANT
SPONGE LAP 4X18 RFD (DISPOSABLE) IMPLANT
SPONGE SURGIFOAM ABS GEL 100 (HEMOSTASIS) ×3 IMPLANT
STRIP CLOSURE SKIN 1/2X4 (GAUZE/BANDAGES/DRESSINGS) ×2 IMPLANT
SUT VIC AB 0 CT1 18XCR BRD8 (SUTURE) ×1 IMPLANT
SUT VIC AB 0 CT1 8-18 (SUTURE) ×2
SUT VIC AB 2-0 CP2 18 (SUTURE) ×3 IMPLANT
SUT VIC AB 3-0 SH 8-18 (SUTURE) ×6 IMPLANT
TOWEL GREEN STERILE (TOWEL DISPOSABLE) ×3 IMPLANT
TOWEL GREEN STERILE FF (TOWEL DISPOSABLE) ×3 IMPLANT
TRAY FOLEY MTR SLVR 16FR STAT (SET/KITS/TRAYS/PACK) ×3 IMPLANT
WATER STERILE IRR 1000ML POUR (IV SOLUTION) ×3 IMPLANT

## 2019-07-19 NOTE — Anesthesia Procedure Notes (Signed)
Procedure Name: Intubation Performed by: Milford Cage, CRNA Pre-anesthesia Checklist: Patient identified, Emergency Drugs available, Suction available and Patient being monitored Patient Re-evaluated:Patient Re-evaluated prior to induction Oxygen Delivery Method: Circle System Utilized Preoxygenation: Pre-oxygenation with 100% oxygen Induction Type: IV induction Ventilation: Mask ventilation without difficulty Laryngoscope Size: Mac and 3 Grade View: Grade I Tube type: Oral Number of attempts: 1 Airway Equipment and Method: Stylet Placement Confirmation: ETT inserted through vocal cords under direct vision,  positive ETCO2 and breath sounds checked- equal and bilateral Secured at: 23 cm Tube secured with: Tape Dental Injury: Teeth and Oropharynx as per pre-operative assessment

## 2019-07-19 NOTE — H&P (Signed)
Subjective: Patient is a 53 y.o. female admitted for plif. Onset of symptoms was several months ago, gradually worsening since that time.  The pain is rated severe, and is located at the across the lower back and radiates to leg. The pain is described as aching and occurs all day. The symptoms have been progressive. Symptoms are exacerbated by exercise. MRI or CT showed adjacent level disease L4-5  Past Medical History:  Diagnosis Date  . Anxiety   . Heart murmur   . Myocardial infarction (HCC)    takosubo cardiomypathy stress related     Past Surgical History:  Procedure Laterality Date  . CHOLECYSTECTOMY  2016  . LAPAROSCOPIC COLON RESECTION  08/2018  . LUMBAR LAMINECTOMY  2014    Prior to Admission medications   Medication Sig Start Date End Date Taking? Authorizing Provider  baclofen (LIORESAL) 10 MG tablet Take 10 mg by mouth 3 (three) times daily.   Yes [provider]  clonazePAM (KLONOPIN) 0.5 MG tablet Take 0.5 mg by mouth 2 (two) times daily as needed for anxiety.   Yes [provider]  escitalopram (LEXAPRO) 10 MG tablet Take 10 mg by mouth daily.   Yes [provider]  fluticasone (FLONASE) 50 MCG/ACT nasal spray Place 1 spray into both nostrils daily as needed for allergies.    Yes [provider]  gabapentin (NEURONTIN) 300 MG capsule Take 300 mg by mouth 2 (two) times daily.   Yes [provider]  magnesium oxide (MAG-OX) 400 MG tablet Take 400 mg by mouth daily.   Yes [provider]  Multiple Vitamin (MULTIVITAMIN WITH MINERALS) TABS tablet Take 1 tablet by mouth daily.   Yes [provider]  oxyCODONE-acetaminophen (PERCOCET/ROXICET) 5-325 MG tablet Take 1 tablet by mouth 3 (three) times daily as needed for pain. 07/05/19  Yes [provider]  zinc gluconate 50 MG tablet Take 50 mg by mouth daily.   Yes [provider]  loratadine (CLARITIN) 10 MG tablet Take 10 mg by mouth daily as needed  for allergies.    [provider]   Allergies  Allergen Reactions  . Penicillins Hives, Shortness Of Breath and Rash    Wheezing  Did it involve swelling of the face/tongue/throat, SOB, or low BP? Yes Did it involve sudden or severe rash/hives, skin peeling, or any reaction on the inside of your mouth or nose? No Did you need to seek medical attention at a hospital or doctor's office? No When did it last happen?30 years ago     Social History   Tobacco Use  . Smoking status: Never Smoker  . Smokeless tobacco: Never Used  Substance Use Topics  . Alcohol use: Yes    Comment: occasional     History reviewed. No pertinent family history.   Review of Systems  Positive ROS: Negative  All other systems have been reviewed and were otherwise negative with the exception of those mentioned in the HPI and as above.  Objective: Vital signs in last 24 hours: Temp:  [98 F (36.7 C)] 98 F (36.7 C) (11/02 1007) Pulse Rate:  [44] 44 (11/02 1007) Resp:  [18] 18 (11/02 1007) BP: (121)/(70) 121/70 (11/02 1007) SpO2:  [100 %] 100 % (11/02 1007) Weight:  [57.6 kg] 57.6 kg (11/02 1007)  General Appearance: Alert, cooperative, no distress, appears stated age Head: Normocephalic, without obvious abnormality, atraumatic Eyes: PERRL, conjunctiva/corneas clear, EOM's intact    Neck: Supple, symmetrical, trachea midline Back: Symmetric, no curvature, ROM  normal, no CVA tenderness Lungs:  respirations unlabored Heart: Regular rate and rhythm Abdomen: Soft, non-tender Extremities: Extremities normal, atraumatic, no cyanosis or edema Pulses: 2+ and symmetric all extremities Skin: Skin color, texture, turgor normal, no rashes or lesions  NEUROLOGIC:   Mental status: Alert and oriented x4,  no aphasia, good attention span, fund of knowledge, and memory Motor Exam - grossly normal Sensory Exam - grossly normal Reflexes: 1+ Coordination - grossly normal Gait - grossly  normal Balance - grossly normal Cranial Nerves: I: smell Not tested  II: visual acuity  OS: nl    OD: nl  II: visual fields Full to confrontation  II: pupils Equal, round, reactive to light  III,VII: ptosis None  III,IV,VI: extraocular muscles  Full ROM  V: mastication Normal  V: facial light touch sensation  Normal  V,VII: corneal reflex  Present  VII: facial muscle function - upper  Normal  VII: facial muscle function - lower Normal  VIII: hearing Not tested  IX: soft palate elevation  Normal  IX,X: gag reflex Present  XI: trapezius strength  5/5  XI: sternocleidomastoid strength 5/5  XI: neck flexion strength  5/5  XII: tongue strength  Normal    Data Review Lab Results  Component Value Date   WBC 8.7 07/15/2019   HGB 15.4 (H) 07/15/2019   HCT 45.8 07/15/2019   MCV 98.1 07/15/2019   PLT 240 07/15/2019   Lab Results  Component Value Date   NA 136 07/15/2019   K 3.8 07/15/2019   CL 100 07/15/2019   CO2 23 07/15/2019   BUN 11 07/15/2019   CREATININE 0.84 07/15/2019   GLUCOSE 88 07/15/2019   Lab Results  Component Value Date   INR 1.0 07/15/2019    Assessment/Plan:  Estimated body mass index is 23.23 kg/m as calculated from the following:   Height as of this encounter: 5\' 2"  (1.575 m).   Weight as of this encounter: 57.6 kg. Patient admitted for PLIF L4-5. Patient has failed a reasonable attempt at conservative therapy.  I explained the condition and procedure to the patient and answered any questions.  Patient wishes to proceed with procedure as planned. Understands risks/ benefits and typical outcomes of procedure.   Eustace Moore 07/19/2019 12:08 PM

## 2019-07-19 NOTE — Transfer of Care (Signed)
Immediate Anesthesia Transfer of Care Note  Patient: Kaitlyn David  Procedure(s) Performed: Posterior Lumbar Interbody Fusion - Lumbar Four -Lumbar Five (N/A Back)  Patient Location: PACU  Anesthesia Type:General  Level of Consciousness: drowsy and patient cooperative  Airway & Oxygen Therapy: Patient Spontanous Breathing  Post-op Assessment: Report given to RN and Post -op Vital signs reviewed and stable  Post vital signs: Reviewed and stable  Last Vitals:  Vitals Value Taken Time  BP 128/80 07/19/19 1516  Temp    Pulse 61 07/19/19 1518  Resp 19 07/19/19 1518  SpO2 100 % 07/19/19 1518  Vitals shown include unvalidated device data.  Last Pain:  Vitals:   07/19/19 1016  TempSrc:   PainSc: 5       Patients Stated Pain Goal: 3 (20/10/07 1219)  Complications: No apparent anesthesia complications

## 2019-07-19 NOTE — Progress Notes (Signed)
Pharmacy Antibiotic Note  Jazzlyn Frederick Klinger is a 53 y.o. female admitted on 07/19/2019 with planned back surgery.  Pharmacy has been consulted for Vancomycin dose x1 post-operatively for surgical prophylaxis with penicillin allergy.  Vancomycin 1 gram was given pre-operatively at 1300 PM. Afebrile. WBC was within normal limits on pre-surgical check.  SCr is within normal limits.   Plan: Vancomycin 1 gram IV x1 at 0100 AM - 12 hours from pre-op dose.  Pharmacy will sign off - please re-consult if needed.   Height: 5\' 2"  (157.5 cm) Weight: 127 lb (57.6 kg) IBW/kg (Calculated) : 50.1  Temp (24hrs), Avg:97.5 F (36.4 C), Min:97 F (36.1 C), Max:98 F (36.7 C)  Recent Labs  Lab 07/15/19 1220  WBC 8.7  CREATININE 0.84    Estimated Creatinine Clearance: 61.3 mL/min (by C-G formula based on SCr of 0.84 mg/dL).    Allergies  Allergen Reactions  . Penicillins Hives, Shortness Of Breath and Rash    Wheezing  Did it involve swelling of the face/tongue/throat, SOB, or low BP? Yes Did it involve sudden or severe rash/hives, skin peeling, or any reaction on the inside of your mouth or nose? No Did you need to seek medical attention at a hospital or doctor's office? No When did it last happen?30 years ago     Thank you for allowing pharmacy to be a part of this patient's care.  Brain Hilts 07/19/2019 6:46 PM

## 2019-07-19 NOTE — Op Note (Signed)
07/19/2019  3:02 PM  PATIENT:  Kaitlyn David  53 y.o. 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 Decompressive lumbar laminectomy L4-5 requiring more work than would be required for a simple exposure of the disk for PLIF in order to adequately decompress the neural elements and address the spinal stenosis 2. Posterior lumbar interbody fusion L4-5 using porous titanium interbody cages packed with morcellized allograft and autograft soaked with a bone marrow aspirate obtained through a separate fascial incision over the right iliac crest 3. Posterior fixation L4-5 using Alphatec cortical pedicle screws.  4. Intertransverse arthrodesis L4-5 using morcellized autograft and allograft.  SURGEON:  Sherley Bounds, MD  ASSISTANTS: Dr. Christella Noa  ANESTHESIA:  General  EBL: 50 ml  Total I/O In: 1000 [I.V.:1000] Out: 170 [Urine:120; Blood:50]  BLOOD ADMINISTERED:none  DRAINS: none   INDICATION FOR PROCEDURE: This patient presented with back and leg pain for quite a long time after work-related injury. Imaging revealed adjacent level spondylosis and degenerative disc disease with retrolisthesis and stenosis at L4-5. The patient tried a reasonable attempt at conservative medical measures without relief. I recommended decompression and instrumented fusion to address the stenosis as well as the segmental  instability.  Patient understood the risks, benefits, and alternatives and potential outcomes and wished to proceed.  PROCEDURE DETAILS:  The patient was brought to the operating room. After induction of generalized endotracheal anesthesia the patient was rolled into the prone position on chest rolls and all pressure points were padded. The patient's lumbar region was cleaned and then prepped with DuraPrep and draped in the usual sterile  fashion. Anesthesia was injected and then a dorsal midline incision was made and carried down to the lumbosacral fascia. The fascia was opened and the paraspinous musculature was taken down in a subperiosteal fashion to expose L4-5. A self-retaining retractor was placed. Intraoperative fluoroscopy confirmed my level, and I started with placement of the L4 cortical pedicle screws. The pedicle screw entry zones were identified utilizing surface landmarks and  AP and lateral fluoroscopy. I scored the cortex with the high-speed drill and then used the hand drill to drill an upward and outward direction into the pedicle. I then tapped line to line. I then placed a 5.5 x 40 cortical pedicle screw into the pedicles of L4 bilaterally.  I then dissected in a suprafascial plane to expose the iliac crest.  Opened the fascia and we used a Jamshidi needle to extract 60 cc of bone marrow aspirate from the iliac crest with the assistance of my nurse practitioner.  This was then spun down by [facility] device and 2 to 4 cc of  BMAC was soaked on morselized allograft for later arthrodesis.  I dried the hole with Surgifoam and closed the fascia.  I then turned my attention to the decompression and complete lumbar laminectomies, hemi- facetectomies, and foraminotomies were performed at L4-5.  My nurse practitioner was directly involved in the decompression and exposure of the neural elements. the patient had significant spinal stenosis and this required more work than would be required for a simple exposure of the disc for posterior lumbar interbody fusion which would only require a limited laminotomy. Much more generous decompression and generous foraminotomy was undertaken in order to adequately decompress the neural elements and address the patient's leg pain. The yellow ligament was removed to expose the underlying dura and nerve  roots, and generous foraminotomies were performed to adequately decompress the neural elements. Both the  exiting and traversing nerve roots were decompressed on both sides until a coronary dilator passed easily along the nerve roots. Once the decompression was complete, I turned my attention to the posterior lower lumbar interbody fusion. The epidural venous vasculature was coagulated and cut sharply. Disc space was incised and the initial discectomy was performed with pituitary rongeurs. The disc space was distracted with sequential distractors to a height of 8 mm. We then used a series of scrapers and shavers to prepare the endplates for fusion. The midline was prepared with Epstein curettes. Once the complete discectomy was finished, we packed an appropriate sized interbody cage with local autograft and morcellized allograft, gently retracted the nerve root, and tapped the cage into position at L4-5.  The midline between the cages was packed with morselized autograft and allograft. We then turned our attention to the placement of the lower pedicle screws. The pedicle screw entry zones were identified utilizing surface landmarks and fluoroscopy. I drilled into each pedicle utilizing the hand drill, and tapped each pedicle with the appropriate tap. We palpated with a ball probe to assure no break in the cortex. We then placed 5.5 x 40 mm cortical pedicle screws into the pedicles bilaterally at L5.  My nurse practitioner and Dr. Franky Macho assisted in placement of the pedicle screws.  We then decorticated the transverse processes and laid a mixture of morcellized autograft and allograft out over these to perform intertransverse arthrodesis at L4-5. We then placed lordotic rods into the multiaxial screw heads of the pedicle screws and locked these in position with the locking caps and anti-torque device. We then checked our construct with AP and lateral fluoroscopy. Irrigated with copious amounts of bacitracin-containing saline solution. Inspected the nerve roots once again to assure adequate decompression, lined to the  dura with Gelfoam, placed powdered vancomycin into the wound, and then we closed the muscle and the fascia with 0 Vicryl. Closed the subcutaneous tissues with 2-0 Vicryl and subcuticular tissues with 3-0 Vicryl. The skin was closed with benzoin and Steri-Strips. Dressing was then applied, the patient was awakened from general anesthesia and transported to the recovery room in stable condition. At the end of the procedure all sponge, needle and instrument counts were correct.   PLAN OF CARE: admit to inpatient  PATIENT DISPOSITION:  PACU - hemodynamically stable.   Delay start of Pharmacological VTE agent (>24hrs) due to surgical blood loss or risk of bleeding:  yes

## 2019-07-20 MED ORDER — CELECOXIB 200 MG PO CAPS: 200.0000 mg | ORAL_CAPSULE | Freq: Two times a day (BID) | ORAL | 0 refills | Status: AC

## 2019-07-20 MED ORDER — METHOCARBAMOL 500 MG PO TABS: 500.0000 mg | ORAL_TABLET | Freq: Four times a day (QID) | ORAL | 1 refills | Status: AC | PRN

## 2019-07-20 MED ORDER — OXYCODONE HCL 10 MG PO TABS: 10.0000 mg | ORAL_TABLET | ORAL | 0 refills | Status: AC | PRN

## 2019-07-20 NOTE — Plan of Care (Signed)
Pt given D/C instructions with verbal understanding. Rx's were sent to pharmacy by MD. Pt's incision is clean and dry with no sign of infection. Pt's IV was removed prior to D/C. Pt received 3-n-1 from [employer] per MD order. Pt D/C'd home via wheelchair per MD order. Pt is stable @ D/C and has no other needs at this time. Holli Humbles, RN

## 2019-07-20 NOTE — Progress Notes (Signed)
Occupational Therapy Evaluation Patient Details Name: Kaitlyn David MRN: [id number] DOB: 05/16/1966 Today's Date: 07/20/2019    History of Present Illness Pt is 53 yo female s/p PLIF of L4-5. PMH including prior lumbar laminectomy, laparoscopic colon resection, myocardial infarction, and heart murmur.    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 Pt currently presents with decreased activity tolerance, knowledge of precautions, and increased pain. Provided education regarding brace management and compensatory strategies for ADLs. Pt requires supervision for all BADLs and functional mobility. Recommend dc home with no OT follow up once medically stable per MD. All education and acute needs met, will sign off.     Follow Up Recommendations  No OT follow up    Equipment Recommendations  3 in 1 bedside commode    Recommendations for Other Services PT consult     Precautions / Restrictions Precautions Precautions: Back Precaution Booklet Issued: Yes (comment) Precaution Comments: Pt recalled 3/3 precautions with increased time Required Braces or Orthoses: Spinal Brace Spinal Brace: Lumbar corset;Applied in sitting position Restrictions Weight Bearing Restrictions: No      Mobility Bed Mobility               General bed mobility comments: Pt sitting EOB upon arrival  Transfers Overall transfer level: Needs assistance Equipment used: None Transfers: Sit to/from Stand Sit to Stand: Supervision         General transfer comment: supervision for safety    Balance Overall balance assessment: No apparent balance deficits (not formally assessed)                                         ADL either performed or assessed with clinical judgement   ADL Overall ADL's : Needs assistance/impaired Eating/Feeding: Independent;Sitting    Grooming: Supervision/safety;Standing Grooming Details (indicate cue type and reason): provided education regarding compensatory technique for oral care Upper Body Bathing: Supervision/ safety;Sitting   Lower Body Bathing: Supervison/ safety;Sit to/from stand   Upper Body Dressing : Supervision/safety;Sitting Upper Body Dressing Details (indicate cue type and reason): Provided education regarding brace management. Donned UB clothing with supervision Lower Body Dressing: Supervision/safety;Sit to/from stand Lower Body Dressing Details (indicate cue type and reason): provided education regarding figure 4 method for LB ADLs. performed with supervision for safety Toilet Transfer: Supervision/safety;Ambulation   Toileting- Clothing Manipulation and Hygiene: Supervision/safety;Sit to/from stand       Functional mobility during ADLs: Supervision/safety General ADL Comments: Performed UB ADLs, LB ADLs, and functional mobility with supervision for safety     Vision Baseline Vision/History: Wears glasses Wears Glasses: At all times Patient Visual Report: No change from baseline       Perception     Praxis      Pertinent Vitals/Pain Pain Assessment: Faces Faces Pain Scale: Hurts a little bit Pain Location: back Pain Descriptors / Indicators: Aching;Discomfort;Grimacing Pain Intervention(s): Monitored during session;Repositioned     Hand Dominance Right   Extremity/Trunk Assessment Upper Extremity Assessment Upper Extremity Assessment: Overall WFL for tasks assessed   Lower Extremity Assessment Lower Extremity Assessment: Defer to PT evaluation   Cervical / Trunk Assessment Cervical / Trunk Assessment: Normal   Communication Communication Communication: No difficulties   Cognition Arousal/Alertness: Awake/alert Behavior During Therapy: WFL for tasks assessed/performed Overall Cognitive Status: Within Functional  Limits for tasks assessed                                      General Comments       Exercises     Shoulder Instructions      Home Living Family/patient expects to be discharged to:: Private residence Living Arrangements: Spouse/significant other Available Help at Discharge: Family;Available PRN/intermittently Type of Home: House Home Access: Stairs to enter [employer] of Steps: 4 Entrance Stairs-Rails: None Home Layout: One level;Laundry or work area in basement;Able to live on main level with bedroom/bathroom     Bathroom Shower/Tub: Occupational psychologist: [place]: Hand held shower head;Adaptive equipment Adaptive Equipment: Reacher;Long-handled shoe horn;Long-handled sponge        Prior Functioning/Environment Level of Independence: Independent        Comments: Pt independent for all ADLs, IADLs, and driving. 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        OT Problem List: Decreased activity tolerance;Decreased knowledge of use of DME or AE;Decreased knowledge of precautions;Pain      OT Treatment/Interventions:      OT Goals(Current goals can be found in the care plan section) Acute Rehab OT Goals Patient Stated Goal: go home OT Goal Formulation: All assessment and education complete, DC therapy  OT Frequency:     Barriers to D/C:            Co-evaluation              AM-PAC OT "6 Clicks" Daily Activity     Outcome Measure Help from another person eating meals?: None Help from another person taking care of personal grooming?: None Help from another person toileting, which includes using toliet, bedpan, or urinal?: None Help from another person bathing (including washing, rinsing, drying)?: None Help from another person to put on and taking off regular upper body clothing?: None Help from another person to put on and taking off regular lower body clothing?: None 6 Click Score: 24   End of Session Equipment Utilized During Treatment: Back  brace Nurse Communication: Mobility status  Activity Tolerance: Patient tolerated treatment well Patient left: in bed;with call bell/phone within reach(sitting EOB)  OT Visit Diagnosis: Pain Pain - part of body: (back)                Time: 2500-3704 OT Time Calculation (min): 18 min Charges:  OT General Charges $OT Visit: 1 Visit OT Evaluation $OT Eval Low Complexity: [place], OT Student  Gus Rankin 07/20/2019, 8:12 AM

## 2019-07-20 NOTE — Discharge Summary (Signed)
Physician Discharge Summary  Patient ID: Kaitlyn David MRN: [id number] DOB/AGE: [birth year] 53 y.o.  Admit date: 07/19/2019 Discharge date: 07/20/2019  Admission Diagnoses: adjacent level spondylosis    Discharge Diagnoses: same   Discharged Condition: good  Hospital Course: The patient was admitted on 07/19/2019 and taken to the operating room where the patient underwent PLIF L4-5. The patient tolerated the procedure well and was taken to the recovery room and then to the floor in stable condition. The hospital course was routine. There were no complications. The wound remained clean dry and intact. Pt had appropriate back soreness. No complaints of leg pain or new N/T/W. The patient remained afebrile with stable vital signs, and tolerated a regular diet. The patient continued to increase activities, and pain was well controlled with oral pain medications.   Consults: None  Significant Diagnostic Studies:  Results for orders placed or performed during the hospital encounter of 07/15/19  SARS CORONAVIRUS 2 (TAT 6-24 HRS) Nasopharyngeal Nasopharyngeal Swab   Specimen: Nasopharyngeal Swab  Result Value Ref Range   SARS Coronavirus 2 NEGATIVE NEGATIVE    Chest 2 View  Result Date: 07/15/2019 CLINICAL DATA:  Pre-op respiratory exam for lumbar spine surgery. Lumbar degenerative disc disease and herniated nucleus pulposus. EXAM: CHEST - 2 VIEW COMPARISON:  None. FINDINGS: The heart size and mediastinal contours are within normal limits. Both lungs are clear. Moderate pectus excavatum noted. IMPRESSION: No active cardiopulmonary disease. Pectus excavatum. Electronically Signed   By: Danae OrleansJohn A Stahl M.D.   On: 07/15/2019 12:47   Dg Lumbar Spine 2-3 Views  Result Date: 07/19/2019 CLINICAL DATA:  L4-L5 PLIF. EXAM: LUMBAR SPINE - 2-3 VIEW; DG C-ARM 1-60 MIN COMPARISON:  MRI lumbar spine dated May 13, 2019. FLUOROSCOPY TIME:  49 seconds. C-arm fluoroscopic images were obtained  intraoperatively and submitted for post operative interpretation. FINDINGS: Frontal and lateral intraoperative fluoroscopic images demonstrate interval L4-L5 PLIF with resolved retrolisthesis. Unchanged L5-S1 ALIF. IMPRESSION: 1. Intraoperative fluoroscopic guidance for L4-L5 PLIF. Electronically Signed   By: Obie DredgeWilliam T Derry M.D.   On: 07/19/2019 15:01   Dg C-arm 1-60 Min  Result Date: 07/19/2019 CLINICAL DATA:  L4-L5 PLIF. EXAM: LUMBAR SPINE - 2-3 VIEW; DG C-ARM 1-60 MIN COMPARISON:  MRI lumbar spine dated May 13, 2019. FLUOROSCOPY TIME:  49 seconds. C-arm fluoroscopic images were obtained intraoperatively and submitted for post operative interpretation. FINDINGS: Frontal and lateral intraoperative fluoroscopic images demonstrate interval L4-L5 PLIF with resolved retrolisthesis. Unchanged L5-S1 ALIF. IMPRESSION: 1. Intraoperative fluoroscopic guidance for L4-L5 PLIF. Electronically Signed   By: Obie DredgeWilliam T Derry M.D.   On: 07/19/2019 15:01    Antibiotics:  Anti-infectives (From admission, onward)   Start     Dose/Rate Route Frequency Ordered Stop   07/20/19 0100  vancomycin (VANCOCIN) IVPB 1000 mg/200 mL premix     1,000 mg 200 mL/hr over 60 Minutes Intravenous  Once 07/19/19 1852 07/20/19 0202   07/19/19 1645  ceFAZolin (ANCEF) IVPB 2g/100 mL premix  Status:  Discontinued     2 g 200 mL/hr over 30 Minutes Intravenous Every 8 hours 07/19/19 1633 07/19/19 1732   07/19/19 1340  bacitracin 50,000 Units in sodium chloride 0.9 % 500 mL irrigation  Status:  Discontinued       As needed 07/19/19 1340 07/19/19 1512   07/19/19 0845  ceFAZolin (ANCEF) IVPB 2g/100 mL premix  Status:  Discontinued     2 g 200 mL/hr over 30 Minutes Intravenous On call to O.R. 07/19/19 [id number] 07/19/19 [id number]  Discharge Exam: Blood pressure 103/64, pulse (!) 52, temperature 98.1 F (36.7 C), temperature source Oral, resp. rate 18, height 5\' 2"  (1.575 m), weight 57.6 kg, SpO2 98 %. Neurologic: Grossly normal Dressing  dry  Discharge Medications:   Allergies as of 07/20/2019      Reactions   Penicillins Hives, Shortness Of Breath, Rash   Wheezing Did it involve swelling of the face/tongue/throat, SOB, or low BP? Yes Did it involve sudden or severe rash/hives, skin peeling, or any reaction on the inside of your mouth or nose? No Did you need to seek medical attention at a hospital or doctor's office? No When did it last happen?30 years ago      Medication List    STOP taking these medications   oxyCODONE-acetaminophen 5-325 MG tablet Commonly known as: PERCOCET/ROXICET     TAKE these medications   baclofen 10 MG tablet Commonly known as: LIORESAL Take 10 mg by mouth 3 (three) times daily.   celecoxib 200 MG capsule Commonly known as: CELEBREX Take 1 capsule (200 mg total) by mouth 2 (two) times daily.   clonazePAM 0.5 MG tablet Commonly known as: KLONOPIN Take 0.5 mg by mouth 2 (two) times daily as needed for anxiety.   escitalopram 10 MG tablet Commonly known as: LEXAPRO Take 10 mg by mouth daily.   fluticasone 50 MCG/ACT nasal spray Commonly known as: FLONASE Place 1 spray into both nostrils daily as needed for allergies.   gabapentin 300 MG capsule Commonly known as: NEURONTIN Take 300 mg by mouth 2 (two) times daily.   loratadine 10 MG tablet Commonly known as: CLARITIN Take 10 mg by mouth daily as needed for allergies.   magnesium oxide 400 MG tablet Commonly known as: MAG-OX Take 400 mg by mouth daily.   methocarbamol 500 MG tablet Commonly known as: ROBAXIN Take 1 tablet (500 mg total) by mouth every 6 (six) hours as needed for muscle spasms.   multivitamin with minerals Tabs tablet Take 1 tablet by mouth daily.   Oxycodone HCl 10 MG Tabs Take 1 tablet (10 mg total) by mouth every 4 (four) hours as needed for severe pain ((score 4 to 6)).   zinc gluconate 50 MG tablet Take 50 mg by mouth daily.            Durable Medical Equipment  (From admission,  onward)         Start     Ordered   07/19/19 1634  DME Walker rolling  Once    Question:  Patient needs a walker to treat with the following condition  Answer:  S/P lumbar fusion   07/19/19 1633   07/19/19 1634  DME 3 n 1  Once     07/19/19 1633          Disposition: home   Final Dx: PLIF L4-5  Discharge Instructions     Remove dressing in 72 hours   Complete by: As directed    Call MD for:  difficulty breathing, headache or visual disturbances   Complete by: As directed    Call MD for:  persistant nausea and vomiting   Complete by: As directed    Call MD for:  redness, tenderness, or signs of infection (pain, swelling, redness, odor or green/yellow discharge around incision site)   Complete by: As directed    Call MD for:  severe uncontrolled pain   Complete by: As directed    Call MD for:  temperature >100.4   Complete by:  As directed    Diet - low sodium heart healthy   Complete by: As directed    Increase activity slowly   Complete by: As directed       Follow-up Information    Tia Alert, MD. Schedule an appointment as soon as possible for a visit in 2 week(s).   Specialty: Neurosurgery Contact information: [address] [place] [address] [phone]            Signed: Tia Alert 07/20/2019, 7:57 AM

## 2019-07-20 NOTE — Evaluation (Signed)
Physical Therapy Evaluation and Discharge Patient Details Name: Kaitlyn David MRN: [id number] DOB: 1966/03/24 Today's Date: 07/20/2019   History of Present Illness  Pt is 53 yo female s/p PLIF of L4-5. PMH including prior lumbar laminectomy, laparoscopic colon resection, myocardial infarction, and heart murmur.   Clinical Impression  Patient evaluated by Physical Therapy with no further acute PT needs identified. All education has been completed and the patient has no further questions. Pt was able to demonstrate transfers and ambulation with gross modified independence and no AD. Supervision provided for safety with stair negotiation however pt will have adequate support at home to complete safely. Pt reports she has an almost 2 hour car ride home and we discussed getting out and ambulating at least once and using pillows for support in the car for pain control. Pt was also educated on precautions, brace application/wearing schedule, appropriate activity progression, and car transfer. See below for any follow-up Physical Therapy or equipment needs. PT is signing off. Thank you for this referral.     Follow Up Recommendations No PT follow up;Supervision - Intermittent    Equipment Recommendations  None recommended by PT    Recommendations for Other Services       Precautions / Restrictions Precautions Precautions: Back Precaution Booklet Issued: Yes (comment) Precaution Comments: Pt recalled 3/3 precautions with increased time Required Braces or Orthoses: Spinal Brace Spinal Brace: Lumbar corset;Applied in sitting position Restrictions Weight Bearing Restrictions: No      Mobility  Bed Mobility Overal bed mobility: Modified Independent             General bed mobility comments: Pt received exiting bathroom and was returned to bed at end of session. Pt was able to demonstrate log roll technique without assistance and with good form. HOB flat and rails lowered to simulate  home environment.   Transfers Overall transfer level: Modified independent Equipment used: None Transfers: Sit to/from Stand           General transfer comment: No assist required. Pt demonstrated the ability to stand without UE support.   Ambulation/Gait Ambulation/Gait assistance: Modified independent (Device/Increase time) Gait Distance (Feet): 400 Feet Assistive device: None Gait Pattern/deviations: WFL(Within Functional Limits) Gait velocity: Decreased Gait velocity interpretation: 1.31 - 2.62 ft/sec, indicative of limited community ambulator General Gait Details: Slow and mildly guarded due to pain but generally with good posture and without unsteadiness.   Stairs Stairs: Yes Stairs assistance: Supervision Stair Management: One rail Right;Step to pattern;Forwards;No rails Number of Stairs: 3 General stair comments: VC's for sequencing and general safety without use of rails. No assist required.   Wheelchair Mobility    Modified Rankin (Stroke Patients Only)       Balance Overall balance assessment: No apparent balance deficits (not formally assessed)                                           Pertinent Vitals/Pain Pain Assessment: Faces Faces Pain Scale: Hurts a little bit Pain Location: back Pain Descriptors / Indicators: Aching;Discomfort;Grimacing Pain Intervention(s): Limited activity within patient's tolerance;Monitored during session;Repositioned    Home Living Family/patient expects to be discharged to:: Private residence Living Arrangements: Spouse/significant other Available Help at Discharge: Family;Available PRN/intermittently Type of Home: House Home Access: Stairs to enter Entrance Stairs-Rails: None Entrance Stairs-Number of Steps: 4 Home Layout: One level;Laundry or work area in basement;Able to live on main  level with bedroom/bathroom Home Equipment: Hand held shower head;Adaptive equipment      Prior Function Level of  Independence: Independent         Comments: Pt independent for all ADLs, IADLs, and driving. 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     [employer]   Dominant Hand: Right    Extremity/Trunk Assessment   Upper Extremity Assessment Upper Extremity Assessment: Overall WFL for tasks assessed    Lower Extremity Assessment Lower Extremity Assessment: Overall WFL for tasks assessed    Cervical / Trunk Assessment Cervical / Trunk Assessment: Normal  Communication   Communication: No difficulties  Cognition Arousal/Alertness: Awake/alert Behavior During Therapy: WFL for tasks assessed/performed Overall Cognitive Status: Within Functional Limits for tasks assessed                                        General Comments      Exercises     Assessment/Plan    PT Assessment Patent does not need any further PT services  PT Problem List         PT Treatment Interventions      PT Goals (Current goals can be found in the Care Plan section)  Acute Rehab PT Goals Patient Stated Goal: go home PT Goal Formulation: All assessment and education complete, DC therapy    Frequency     Barriers to discharge        Co-evaluation               AM-PAC PT "6 Clicks" Mobility  Outcome Measure Help needed turning from your back to your side while in a flat bed without using bedrails?: None Help needed moving from lying on your back to sitting on the side of a flat bed without using bedrails?: None Help needed moving to and from a bed to a chair (including a wheelchair)?: None Help needed standing up from a chair using your arms (e.g., wheelchair or bedside chair)?: None Help needed to walk in hospital room?: None Help needed climbing 3-5 steps with a railing? : None 6 Click Score: 24    End of Session Equipment Utilized During Treatment: Back brace Activity Tolerance: Patient tolerated treatment well Patient left: in bed;with call bell/phone  within reach Nurse Communication: Mobility status PT Visit Diagnosis: Unsteadiness on feet (R26.81);Pain Pain - part of body: (back)    Time: 0811-0826 PT Time Calculation (min) (ACUTE ONLY): 15 min   Charges:   PT Evaluation $PT Eval Low Complexity: 1 Low          Rolinda Roan, PT, DPT Acute Rehabilitation Services Pager: [phone] Office: [phone]   Thelma Comp 07/20/2019, 12:22 PM

## 2019-07-20 NOTE — Discharge Instructions (Signed)
Wound Care °Keep incision covered and dry for one week.  If you shower prior to then, cover incision with plastic wrap.  °You may remove outer bandage after one week and shower.  °Do not put any creams, lotions, or ointments on incision. °Leave steri-strips on neck.  They will fall off by themselves. °Activity °Walk each and every day, increasing distance each day. °No lifting greater than 5 lbs.  Avoid bending, arching, or twisting. °No driving for 2 weeks; may ride as a passenger locally. °If provided with back brace, wear when out of bed.  It is not necessary to wear in bed. °Diet °Resume your normal diet.  °Return to Work °Will be discussed at you follow up appointment. °Call Your Doctor If Any of These Occur °Redness, drainage, or swelling at the wound.  °Temperature greater than 101 degrees. °Severe pain not relieved by pain medication. °Incision starts to come apart. °Follow Up Appt °Call today for appointment in 1-2 weeks ([phone]) or for problems.  If you have any hardware placed in your spine, you will need an x-ray before your appointment. ° ° °Spinal Fusion, Adult, Care After °This sheet gives you information about how to care for yourself after your procedure. Your doctor may also give you more specific instructions. If you have problems or questions, contact your doctor. °Follow these instructions at home: °Medicines °· Take over-the-counter and prescription medicines only as told by your doctor. These include any medicines for pain or blood-thinning medicines (anticoagulants). °· If you were prescribed an antibiotic medicine, take it as told by your doctor. Do not stop taking the antibiotic even if you start to feel better. °· Do not drive for 24 hours if you were given a medicine to help you relax (sedative) during your procedure. °· Do not drive or use heavy machinery while taking prescription pain medicine. °If you have a brace: °· Wear the brace as told by your doctor. Take it off only as told by  your doctor. °· Keep the brace clean. °Managing pain, stiffness, and swelling °· If directed, put ice on the surgery area: °? If you have a removable brace, take it off as told by your doctor. °? Put ice in a plastic bag. °? Place a towel between your skin and the bag. °? Leave the ice on for 20 minutes, 2-3 times a day. °Surgery cut care ° °· Follow instructions from your doctor about how to take care of your cut from surgery (incision). Make sure you: °? Wash your hands with soap and water before you change your bandage (dressing). If you cannot use soap and water, use hand sanitizer. °? Change your bandage as told by your doctor. °? Leave stitches (sutures), skin glue, or skin tape (adhesive) strips in place. They may need to stay in place for 2 weeks or longer. If tape strips get loose and curl up, you may trim the loose edges. Do not remove tape strips completely unless your doctor says it is okay. °· Keep your cut from surgery clean and dry. °? Do not take baths, swim, or use a hot tub until your doctor says it is okay. °? Ask your doctor if you can take showers. You may only be allowed to take sponge baths. °· Every day, check your cut from surgery and the area around it for: °? More redness, swelling, or pain. °? Fluid or blood. °? Warmth. °? Pus or a bad smell. °· If you have a drain tube, follow instructions   from your doctor about caring for it. Do not take out the drain tube or any bandages unless your doctor says it is okay. °Physical activity °· Rest and protect your back as much as possible. °· Follow instructions from your doctor about how to move. Use good posture to help your spine heal. °· Do not lift anything that is heavier than 8 lb (3.6 kg), or the limit that you are told, until your doctor says that it is safe. °· Do not twist or bend at the waist until your doctor says it is okay. °· It is best if you: °? Do not make pushing and pulling motions. °? Do not sit or lie down in the same position  for a long time. °? Do not raise your hands or arms above your head. °· Return to your normal activities as told by your doctor. Ask your doctor what activities are safe for you. Rest and protect your back as much as you can. °· Do not start to exercise until your doctor says it is okay. Ask your doctor what kinds of exercise you can do to make your back stronger. °General instructions °· To prevent blood clots and lessen swelling in your legs: °? Wear compression stockings as told. °? Walk one or more times every few hours as told by your doctor. °· Do not use any products that contain nicotine or tobacco, such as cigarettes and e-cigarettes. These can delay bone healing. If you need help quitting, ask your doctor. °· To prevent or treat constipation while you are taking prescription pain medicine, your doctor may suggest that you: °? Drink enough fluid to keep your pee (urine) pale yellow. °? Take over-the-counter or prescription medicines. °? Eat foods that are high in fiber. These include fresh fruits and vegetables, whole grains, and beans. °? Limit foods that are high in fat and processed sugars, such as fried and sweet foods. °· Keep all follow-up visits as told by your doctor. This is important. °Contact a doctor if: °· Your pain gets worse. °· Your medicine does not help your pain. °· Your legs or feet get painful or swollen. °· Your cut from surgery is more red, swollen, or painful. °· Your cut from surgery feels warm to the touch. °· You have: °? Fluid or blood coming from your cut from surgery. °? Pus or a bad smell coming from your cut from surgery. °? A fever. °? Weakness or loss of feeling (numbness) in your legs that is new or getting worse. °? Trouble controlling when you pee (urinate) or poop (have a bowel movement). °· You feel sick to your stomach (nauseous). °· You throw up (vomit). °Get help right away if: °· Your pain is very bad. °· You have chest pain. °· You have trouble breathing. °· You  start to have a cough. °These symptoms may be an emergency. Do not wait to see if the symptoms will go away. Get medical help right away. Call your local emergency services (911 in the [place] Do not drive yourself to the hospital. °Summary °· After the procedure, it is common to have pain in your back and pain by your surgery cut(s). °· Icing and pain medicines may help to control the pain. Follow directions from your doctor. °· Rest and protect your back as much as possible. Do not twist or bend at the waist. °· Get up and walk one or more times every few hours as told by your doctor. °This information   is not intended to replace advice given to you by your health care provider. Make sure you discuss any questions you have with your health care provider. °Document Released: 12/27/2010 Document Revised: 12/24/2018 Document Reviewed: 12/17/2016 °Elsevier Patient Education © 2020 [employer] ° °

## 2019-07-22 NOTE — Anesthesia Postprocedure Evaluation (Signed)
Anesthesia Post Note  Patient: Kaitlyn David  Procedure(s) Performed: Posterior Lumbar Interbody Fusion - Lumbar Four -Lumbar Five (N/A Back)     Patient location during evaluation: PACU Anesthesia Type: General Level of consciousness: sedated Pain management: pain level controlled Vital Signs Assessment: post-procedure vital signs reviewed and stable Cardiovascular status: stable Postop Assessment: no apparent nausea or vomiting Anesthetic complications: no    Last Vitals:  Vitals:   07/20/19 0435 07/20/19 0802  BP: 103/64 123/81  Pulse: (!) 52 (!) 53  Resp: 18   Temp: 36.7 C 36.9 C  SpO2: 98% 98%    Last Pain:  Vitals:   07/20/19 0940  TempSrc:   PainSc: 6    Pain Goal: Patients Stated Pain Goal: 3 (07/20/19 0940)                 Huston Foley

## 2020-07-15 IMAGING — RF DG LUMBAR SPINE 2-3V
1 series · 2 of 2 positions shown · non-contrast
Comparison: MRI lumbar spine dated May 13, 2019.

CLINICAL DATA: L4-L5 PLIF.

EXAM:
LUMBAR SPINE - 2-3 VIEW; DG C-ARM 1-60 MIN

[Series 1: run · 2 of 2 slices shown]
[im 1/2]
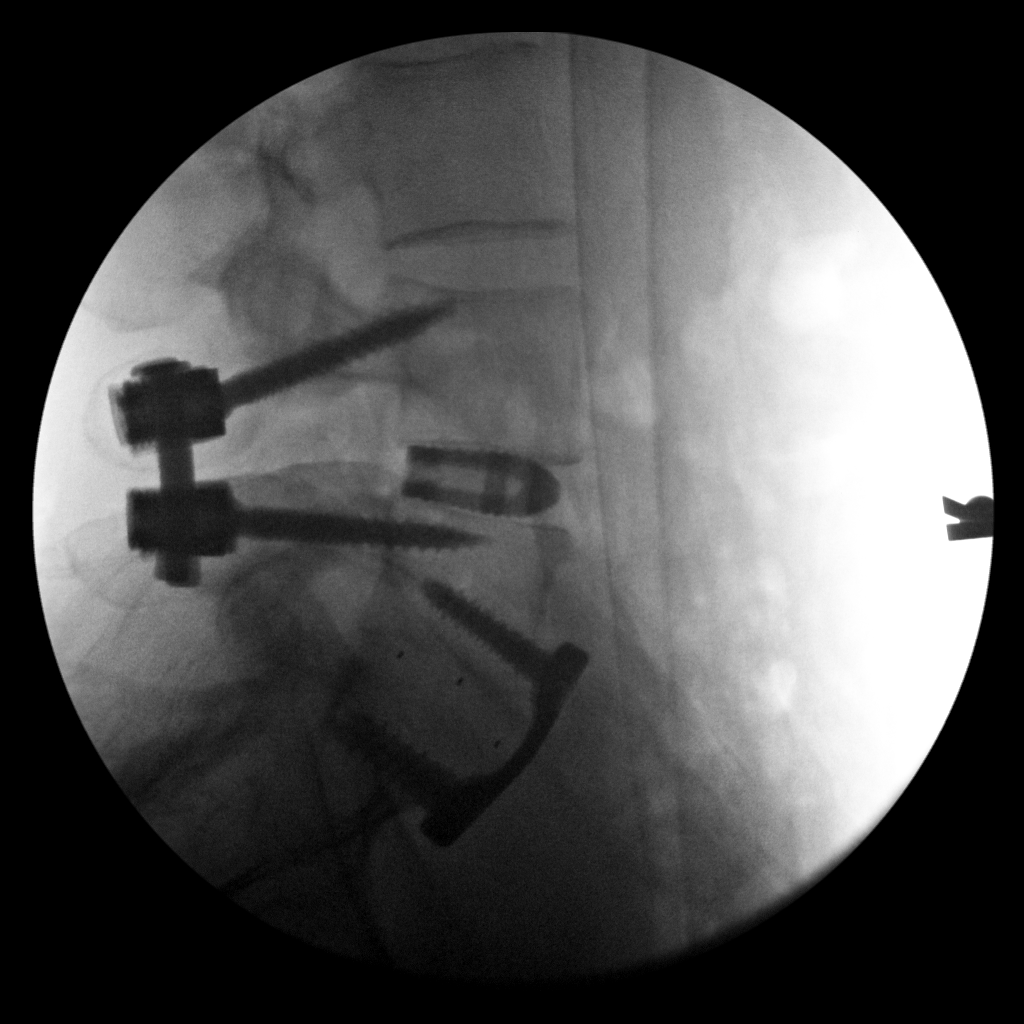
[im 2/2]
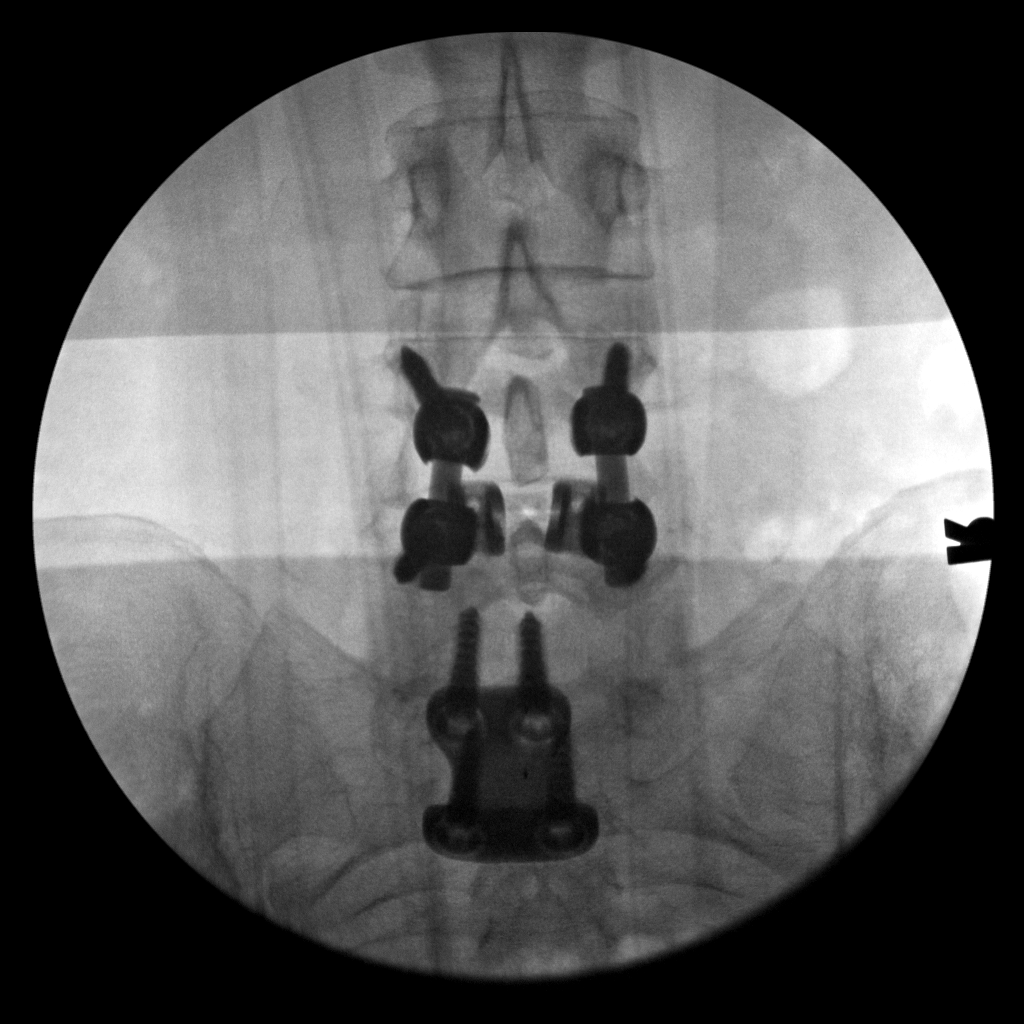

[2 of 2 positions shown; findings below may reference images not displayed]

FLUOROSCOPY TIME:  49 seconds.

C-arm fluoroscopic images were obtained intraoperatively and
submitted for post operative interpretation.
FINDINGS: Frontal and lateral intraoperative fluoroscopic images demonstrate
interval L4-L5 PLIF with resolved retrolisthesis. Unchanged L5-S1
ALIF.
IMPRESSION: 1. Intraoperative fluoroscopic guidance for L4-L5 PLIF.

## 8387-05-18 DEATH — deceased
